# Patient Record
Sex: Male | Born: 1985 | Race: Black or African American | Hispanic: No | Marital: Married | Smoking: Current every day smoker
Health system: Southern US, Community
[De-identification: ages and names within clinical notes are randomized; demographics above are authoritative.]

## PROBLEM LIST (undated history)

## (undated) DIAGNOSIS — Q899 Congenital malformation, unspecified: Secondary | ICD-10-CM

## (undated) DIAGNOSIS — G43909 Migraine, unspecified, not intractable, without status migrainosus: Secondary | ICD-10-CM

## (undated) HISTORY — PX: HIP SURGERY: SHX245

---

## 2005-11-09 ENCOUNTER — Emergency Department: Payer: Self-pay | Admitting: Emergency Medicine

## 2010-07-07 ENCOUNTER — Emergency Department: Payer: Self-pay | Admitting: Emergency Medicine

## 2016-05-23 ENCOUNTER — Emergency Department
Admission: EM | Admit: 2016-05-23 | Discharge: 2016-05-23 | Disposition: A | Discharge status: Home / Self Care | Payer: Self-pay | Attending: Emergency Medicine | Admitting: Emergency Medicine

## 2016-05-23 ENCOUNTER — Encounter: Payer: Self-pay | Admitting: Emergency Medicine

## 2016-05-23 DIAGNOSIS — F17210 Nicotine dependence, cigarettes, uncomplicated: Secondary | ICD-10-CM | POA: Insufficient documentation

## 2016-05-23 DIAGNOSIS — Z0000 Encounter for general adult medical examination without abnormal findings: Secondary | ICD-10-CM | POA: Insufficient documentation

## 2016-05-23 DIAGNOSIS — Z791 Long term (current) use of non-steroidal anti-inflammatories (NSAID): Secondary | ICD-10-CM | POA: Insufficient documentation

## 2016-05-23 NOTE — ED Notes (Signed)
Police officer at bedside

## 2016-05-23 NOTE — ED Notes (Signed)
Police office at bedside

## 2016-05-23 NOTE — ED Notes (Signed)
Pt discharged to home with officer.  Discharged paperwork reviewed with patient.  Pt verbalized understanding

## 2016-05-23 NOTE — ED Notes (Signed)
Pt resting in bed, called for blanket
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
## 2017-06-20 ENCOUNTER — Encounter: Payer: Self-pay | Admitting: *Deleted

## 2017-06-20 ENCOUNTER — Emergency Department: Payer: Worker's Compensation

## 2017-06-20 ENCOUNTER — Emergency Department
Admission: EM | Admit: 2017-06-20 | Discharge: 2017-06-20 | Disposition: A | Discharge status: Home / Self Care | Payer: Worker's Compensation | Attending: Emergency Medicine | Admitting: Emergency Medicine

## 2017-06-20 DIAGNOSIS — S161XXA Strain of muscle, fascia and tendon at neck level, initial encounter: Secondary | ICD-10-CM | POA: Diagnosis not present

## 2017-06-20 DIAGNOSIS — F17210 Nicotine dependence, cigarettes, uncomplicated: Secondary | ICD-10-CM | POA: Insufficient documentation

## 2017-06-20 DIAGNOSIS — Y939 Activity, unspecified: Secondary | ICD-10-CM | POA: Insufficient documentation

## 2017-06-20 DIAGNOSIS — Y999 Unspecified external cause status: Secondary | ICD-10-CM | POA: Insufficient documentation

## 2017-06-20 DIAGNOSIS — W228XXA Striking against or struck by other objects, initial encounter: Secondary | ICD-10-CM | POA: Diagnosis not present

## 2017-06-20 DIAGNOSIS — Y929 Unspecified place or not applicable: Secondary | ICD-10-CM | POA: Diagnosis not present

## 2017-06-20 DIAGNOSIS — S0990XA Unspecified injury of head, initial encounter: Secondary | ICD-10-CM

## 2017-06-20 MED ORDER — ORPHENADRINE CITRATE 30 MG/ML IJ SOLN
60.0000 mg | Freq: Once | INTRAMUSCULAR | Status: AC
  Administered 2017-06-20: 60 mg via INTRAMUSCULAR
  Filled 2017-06-20: qty 2

## 2017-06-20 MED ORDER — KETOROLAC TROMETHAMINE 30 MG/ML IJ SOLN
30.0000 mg | Freq: Once | INTRAMUSCULAR | Status: AC
Start: 2017-06-20 — End: 2017-06-20
  Administered 2017-06-20: 30 mg via INTRAMUSCULAR
  Filled 2017-06-20: qty 1

## 2017-06-20 MED ORDER — MELOXICAM 15 MG PO TABS: 15.0000 mg | ORAL_TABLET | Freq: Every day | ORAL | 0 refills | Status: DC

## 2017-06-20 MED ORDER — METHOCARBAMOL 500 MG PO TABS: 500.0000 mg | ORAL_TABLET | Freq: Four times a day (QID) | ORAL | 0 refills | Status: AC

## 2017-06-20 NOTE — ED Notes (Signed)
Pt returned from scans via stretcher.  

## 2017-06-20 NOTE — ED Notes (Signed)
PT WAS SENT TO ED BY [employer]. WE WERE NOT AWARE THE PT WAS A WORKERS COMP PT UNTIL [facility]IA ( PT ACCT. REP) INFORMED US. I NOTIFIED LISA (RN)TO HELP ME PULL THE EMPLOYER PROFILE. I CALLED THE CONTACT PERSON (LEAH STALLINGS) TO SEE WHICH URINE PANEL TO RUN. SHE DIDN'T KNOW SO SHE HAD MEGAN HAHNE CALL THIS NA BACK WITH THE CORRECT INFORMATION. I INFORMED MEGAN I WOULD SEND ALL THE PAPERWORK BACK WITH THE PT. CURRENTLY WAITING FOR PT TO PROVIDE ANOTHER URINE SAMPLE.

## 2017-06-20 NOTE — ED Provider Notes (Signed)
[facility] Emergency Department Provider Note  ____________________________________________  Time seen: Approximately 7:21 PM  I have reviewed the triage vital signs and the nursing notes.   HISTORY  Chief Complaint Head Injury and Neck Injury    HPI Adam Freeman is a 31 y.o. male who presents to emergency department complaining of headache, neck pain, low back pain status post an injury today. Patient reports that he was working when a metal box being moved by a crane struck him in the back of the head/neck region. Patient reports that he did not lose consciousness but had a sudden lightning strike" sensation when through his body. Patient reports that sensation was momentary and has since resolved. He denies any radicular symptoms at this time. Patient reports an ongoing low-grade headache and some neck and back pain. Patient reports that it feels "tight". No medications for this complaint prior to arrival. No other symptoms or complaints at this time.   No past medical history on file.  There are no active problems to display for this patient.   No past surgical history on file.  Prior to Admission medications   Medication Sig Start Date End Date Taking? Authorizing Provider  etodolac (LODINE) 500 MG tablet Take 500 mg by mouth 2 (two) times daily.    [provider]  meloxicam (MOBIC) 15 MG tablet Take 1 tablet (15 mg total) by mouth daily. 06/20/17   Marylou Wages, Delorise Royals, PA-C  methocarbamol (ROBAXIN) 500 MG tablet Take 1 tablet (500 mg total) by mouth 4 (four) times daily. 06/20/17   Canda Podgorski, Delorise Royals, PA-C    Allergies Penicillins  No family history on file.  Social History Social History  Substance Use Topics  . Smoking status: Current Every Day Smoker    Types: Cigarettes  . Smokeless tobacco: Never Used  . Alcohol use Yes     Review of Systems  Constitutional: No fever/chills Eyes: No visual changes.  Cardiovascular:  no chest pain. Respiratory: no cough. No SOB. Gastrointestinal: No abdominal pain.  No nausea, no vomiting.   Musculoskeletal: Positive for neck and back pain Skin: Negative for rash, abrasions, lacerations, ecchymosis. Neurological: Positive for headache but denies focal weakness or numbness. 10-point ROS otherwise negative.  ____________________________________________   PHYSICAL EXAM:  VITAL SIGNS: ED Triage Vitals  Enc Vitals Group     BP 06/20/17 1854 (!) 115/48     Pulse Rate 06/20/17 1854 64     Resp 06/20/17 1854 20     Temp 06/20/17 1854 98.6 F (37 C)     Temp Source 06/20/17 1854 Oral     SpO2 06/20/17 1854 99 %     Weight 06/20/17 1854 170 lb (77.1 kg)     Height 06/20/17 1854 5\' 11"  (1.803 m)     Head Circumference --      Peak Flow --      Pain Score 06/20/17 1852 9     Pain Loc --      Pain Edu? --      Excl. in GC? --      Constitutional: Alert and oriented. Well appearing and in no acute distress. Eyes: Conjunctivae are normal. PERRL. EOMI. Head: Atraumatic. Patient is nontender to palpation over the osseous structures of the skull and face. Signs. No raccoon eyes. No serous fluid drainage from ears or nares. ENT:      Ears:       Nose: No congestion/rhinnorhea.      Mouth/Throat: Mucous membranes  are moist.  Neck: No stridor.  No midline cervical spine tenderness to palpation. Patient is diffusely tender to palpation bilateral paraspinal muscle groups. No palpable abnormality. Radial pulse intact bilateral upper extremities. Sensation intact and equal bilateral upper extremities and all dermatomal distributions.  Cardiovascular: Normal rate, regular rhythm. Normal S1 and S2.  Good peripheral circulation. Respiratory: Normal respiratory effort without tachypnea or retractions. Lungs CTAB. Good air entry to the bases with no decreased or absent breath sounds. Musculoskeletal: Full range of motion to all extremities. No gross deformities appreciated. No  visible deformity or edema noted to spine upon inspection. Full range of motion to thoracic and lumbar spine. No tenderness to palpation. No palpable abnormality. Dorsalis pedis pulse intact bilateral lower extremity strength. Sensation intact and equal bilateral lower extremities in all dermatomal distributions. Neurologic:  Normal speech and language. No gross focal neurologic deficits are appreciated. Cranial nerves II through XII grossly intact. Skin:  Skin is warm, dry and intact. No rash noted. Psychiatric: Mood and affect are normal. Speech and behavior are normal. Patient exhibits appropriate insight and judgement.   ____________________________________________   LABS (all labs ordered are listed, but only abnormal results are displayed)  Labs Reviewed - No data to display ____________________________________________  EKG   ____________________________________________  RADIOLOGY Festus BarrenI, Jayen Bromwell D Nicolae Vasek, personally viewed and evaluated these images (plain radiographs) as part of my medical decision making, as well as reviewing the written report by the radiologist.  Dg Lumbar Spine Complete  Result Date: 06/20/2017 CLINICAL DATA:  Patient with head injury. Headache. Neck pain. Numbness. Low back pain. Initial encounter. EXAM: LUMBAR SPINE - COMPLETE 4+ VIEW COMPARISON:  None. FINDINGS: Normal anatomic alignment. Preservation of the intervertebral disc space heights. No evidence for static listhesis. T11-12 degenerative disc disease. Age-indeterminate superior endplate height loss of the T11 vertebral body. SI joints are unremarkable. IMPRESSION: Age-indeterminate height loss of the superior endplate of the T11 vertebral body. Recommend correlation for point tenderness. Unremarkable appearance of the lumbar spine. Electronically Signed   By: Annia Beltrew  Davis M.D.   On: 06/20/2017 20:13   Ct Head Wo Contrast  Result Date: 06/20/2017 CLINICAL DATA:  Patient struck back of head on metal at  work today. Headache and neck pain. EXAM: CT HEAD WITHOUT CONTRAST CT CERVICAL SPINE WITHOUT CONTRAST TECHNIQUE: Multidetector CT imaging of the head and cervical spine was performed following the standard protocol without intravenous contrast. Multiplanar CT image reconstructions of the cervical spine were also generated. COMPARISON:  None. FINDINGS: CT HEAD FINDINGS Brain: There is no evidence for acute hemorrhage, hydrocephalus, mass lesion, or abnormal extra-axial fluid collection. No definite CT evidence for acute infarction. Soft tissue fullness noted in the foramen magnum in although there is prominent streak artifact in this region, and a component of cerebellar tonsillar ectopia cannot be excluded. Vascular: No hyperdense vessel or unexpected calcification. Skull: No evidence for fracture. No worrisome lytic or sclerotic lesion. Sinuses/Orbits: The visualized paranasal sinuses and mastoid air cells are clear. Visualized portions of the globes and intraorbital fat are unremarkable. Other: None. CT CERVICAL SPINE FINDINGS Alignment: Straightening of normal cervical lordosis. No subluxation. Skull base and vertebrae: No acute fracture. No primary bone lesion or focal pathologic process. Soft tissues and spinal canal: No prevertebral fluid or swelling. No visible canal hematoma. Soft tissue fullness noted in the region of the foramen magnum. Disc levels:  Preserved. Upper chest: Negative. Other: None. IMPRESSION: 1. No acute intracranial abnormality. 2. Soft tissue fullness in the foramen magnum, suspicious for  cerebellar tonsillar ectopia. Follow-up nonemergent outpatient brain MRI could be used to further evaluate, as clinically warranted. 3. No evidence for cervical spine fracture. Loss of cervical lordosis. This can be related to patient positioning, muscle spasm or soft tissue injury. Electronically Signed   By: Kennith Center M.D.   On: 06/20/2017 20:22   Ct Cervical Spine Wo Contrast  Result Date:  06/20/2017 CLINICAL DATA:  Patient struck back of head on metal at work today. Headache and neck pain. EXAM: CT HEAD WITHOUT CONTRAST CT CERVICAL SPINE WITHOUT CONTRAST TECHNIQUE: Multidetector CT imaging of the head and cervical spine was performed following the standard protocol without intravenous contrast. Multiplanar CT image reconstructions of the cervical spine were also generated. COMPARISON:  None. FINDINGS: CT HEAD FINDINGS Brain: There is no evidence for acute hemorrhage, hydrocephalus, mass lesion, or abnormal extra-axial fluid collection. No definite CT evidence for acute infarction. Soft tissue fullness noted in the foramen magnum in although there is prominent streak artifact in this region, and a component of cerebellar tonsillar ectopia cannot be excluded. Vascular: No hyperdense vessel or unexpected calcification. Skull: No evidence for fracture. No worrisome lytic or sclerotic lesion. Sinuses/Orbits: The visualized paranasal sinuses and mastoid air cells are clear. Visualized portions of the globes and intraorbital fat are unremarkable. Other: None. CT CERVICAL SPINE FINDINGS Alignment: Straightening of normal cervical lordosis. No subluxation. Skull base and vertebrae: No acute fracture. No primary bone lesion or focal pathologic process. Soft tissues and spinal canal: No prevertebral fluid or swelling. No visible canal hematoma. Soft tissue fullness noted in the region of the foramen magnum. Disc levels:  Preserved. Upper chest: Negative. Other: None. IMPRESSION: 1. No acute intracranial abnormality. 2. Soft tissue fullness in the foramen magnum, suspicious for cerebellar tonsillar ectopia. Follow-up nonemergent outpatient brain MRI could be used to further evaluate, as clinically warranted. 3. No evidence for cervical spine fracture. Loss of cervical lordosis. This can be related to patient positioning, muscle spasm or soft tissue injury. Electronically Signed   By: Kennith Center M.D.   On:  06/20/2017 20:22    ____________________________________________    PROCEDURES  Procedure(s) performed:    Procedures    Medications  ketorolac (TORADOL) 30 MG/ML injection 30 mg (30 mg Intramuscular Given 06/20/17 2144)  orphenadrine (NORFLEX) injection 60 mg (60 mg Intramuscular Given 06/20/17 2144)     ____________________________________________   INITIAL IMPRESSION / ASSESSMENT AND PLAN / ED COURSE  Pertinent labs & imaging results that were available during my care of the patient were reviewed by me and considered in my medical decision making (see chart for details).  Review of the [place] CSRS was performed in accordance of the [facility] prior to dispensing any controlled drugs.     Patient's diagnosis is consistent with minor head injury with strain of neck muscle. Patient's CT scan of the head and neck returned without any acute intracranial or osseous abnormality. Incidental finding consistent with Chiari malformation is visualized. At this time, patient is stable for discharge with symptomatic control medications. 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 Patient was given Toradol and muscle relaxer emergency department for symptom control. Patient will be discharged home with prescriptions for meloxicam and Robaxin for symptom control.   On the CT scan, incidental finding of possible Chiari malformation is visualized. Patient is currently asymptomatic free and does not suffer from chronic neurological deficits. At this time, there is no emergent condition warranting further evaluation with MRI. Patient is advised of findings and  verbalizes that he will follow up with primary care for MRI on an outpatient basis. Should anything change before he is evaluated with MRI or by primary care, he understands to present to the emergency department.   Patient is given ED precautions to return to the ED for any worsening or new  symptoms.     ____________________________________________  FINAL CLINICAL IMPRESSION(S) / ED DIAGNOSES  Final diagnoses:  Minor head injury, initial encounter  Strain of neck muscle, initial encounter      NEW MEDICATIONS STARTED DURING THIS VISIT:  Discharge Medication List as of 06/20/2017  9:41 PM    START taking these medications   Details  meloxicam (MOBIC) 15 MG tablet Take 1 tablet (15 mg total) by mouth daily., Starting Tue 06/20/2017, Print    methocarbamol (ROBAXIN) 500 MG tablet Take 1 tablet (500 mg total) by mouth 4 (four) times daily., Starting Tue 06/20/2017, Print            This chart was dictated using voice recognition software/Dragon. Despite best efforts to proofread, errors can occur which can change the meaning. Any change was purely unintentional.    Racheal Patches, PA-C 06/21/17 0000    Minna Antis, MD 06/30/17 [phone]

## 2017-06-20 NOTE — ED Triage Notes (Signed)
Pt reports striking back of head on metal at work today.  No loc.  Pt has a headache, neck pain and had a moment of numbness thruout the body.  No numbness in traige.  Pt alert.  Speech clear    No n/v.  c-collar applied in triage.

## 2018-08-02 ENCOUNTER — Encounter: Payer: Self-pay | Admitting: Emergency Medicine

## 2018-08-02 ENCOUNTER — Emergency Department: Payer: Self-pay

## 2018-08-02 ENCOUNTER — Emergency Department
Admission: EM | Admit: 2018-08-02 | Discharge: 2018-08-02 | Disposition: A | Discharge status: Home / Self Care | Payer: Self-pay | Attending: Emergency Medicine | Admitting: Emergency Medicine

## 2018-08-02 ENCOUNTER — Other Ambulatory Visit: Payer: Self-pay

## 2018-08-02 DIAGNOSIS — F17210 Nicotine dependence, cigarettes, uncomplicated: Secondary | ICD-10-CM | POA: Insufficient documentation

## 2018-08-02 DIAGNOSIS — Y9389 Activity, other specified: Secondary | ICD-10-CM | POA: Insufficient documentation

## 2018-08-02 DIAGNOSIS — Z79899 Other long term (current) drug therapy: Secondary | ICD-10-CM | POA: Insufficient documentation

## 2018-08-02 DIAGNOSIS — M7062 Trochanteric bursitis, left hip: Secondary | ICD-10-CM | POA: Insufficient documentation

## 2018-08-02 MED ORDER — MELOXICAM 15 MG PO TABS: 15.0000 mg | ORAL_TABLET | Freq: Every day | ORAL | 1 refills | Status: AC

## 2018-08-02 MED ORDER — PREDNISONE 50 MG PO TABS: ORAL_TABLET | ORAL | 0 refills | Status: AC
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
## 2018-08-02 NOTE — ED Notes (Signed)
Pt states pain significantly increased starting today and is worse than his normal chronic pain. Pt states in past he has been taking ibuprofen and aleve, heat pack application to the area to decrease pain but says that is not working for the pain he is having today.
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
## 2018-08-16 ENCOUNTER — Emergency Department
Admission: EM | Admit: 2018-08-16 | Discharge: 2018-08-16 | Disposition: A | Discharge status: Home / Self Care | Payer: Self-pay | Attending: Emergency Medicine | Admitting: Emergency Medicine

## 2018-08-16 ENCOUNTER — Other Ambulatory Visit: Payer: Self-pay

## 2018-08-16 ENCOUNTER — Encounter: Payer: Self-pay | Admitting: Emergency Medicine

## 2018-08-16 DIAGNOSIS — M25552 Pain in left hip: Secondary | ICD-10-CM | POA: Insufficient documentation

## 2018-08-16 DIAGNOSIS — F17210 Nicotine dependence, cigarettes, uncomplicated: Secondary | ICD-10-CM | POA: Insufficient documentation

## 2018-08-16 DIAGNOSIS — Z79899 Other long term (current) drug therapy: Secondary | ICD-10-CM | POA: Insufficient documentation

## 2018-08-16 MED ORDER — LIDOCAINE 5 % EX PTCH
1.0000 | MEDICATED_PATCH | CUTANEOUS | Status: DC
  Administered 2018-08-16: 1 via TRANSDERMAL
  Filled 2018-08-16: qty 1

## 2018-08-16 MED ORDER — CYCLOBENZAPRINE HCL 5 MG PO TABS: ORAL_TABLET | ORAL | 0 refills | Status: DC

## 2018-08-16 MED ORDER — KETOROLAC TROMETHAMINE 30 MG/ML IJ SOLN
30.0000 mg | Freq: Once | INTRAMUSCULAR | Status: AC
  Administered 2018-08-16: 30 mg via INTRAMUSCULAR
  Filled 2018-08-16: qty 1

## 2018-08-16 MED ORDER — KETOROLAC TROMETHAMINE 10 MG PO TABS: 10.0000 mg | ORAL_TABLET | Freq: Four times a day (QID) | ORAL | 0 refills | Status: AC | PRN

## 2018-08-16 MED ORDER — LIDOCAINE 5 % EX PTCH: 1.0000 | MEDICATED_PATCH | CUTANEOUS | 0 refills | Status: AC

## 2018-08-16 MED ORDER — KETOROLAC TROMETHAMINE 10 MG PO TABS: 10.0000 mg | ORAL_TABLET | Freq: Four times a day (QID) | ORAL | 0 refills | Status: DC | PRN

## 2018-08-16 NOTE — ED Triage Notes (Signed)
Patient ambulatory to triage with steady gait, without difficulty or distress noted; pt reports seen recently for left hip pain; st is wanting something stronger for pain due to persistent pain

## 2018-08-16 NOTE — ED Notes (Signed)
This RN reviewed discharge instructions, follow-up care, prescriptions, and cryotherapy. Patient verbalized understanding of all reviewed information.  Patient stable, with no distress noted at this time.

## 2018-08-16 NOTE — ED Provider Notes (Signed)
[facility] Emergency Department Provider Note  ____________________________________________  Time seen: Approximately 9:07 PM  I have reviewed the triage vital signs and the nursing notes.   HISTORY  Chief Complaint No chief complaint on file.    HPI Adam Rankinrince O Freeman is a 32 y.o. male presents emergency department for evaluation of left hip pain for months.  Patient states that pain is over the side of his left hip.  Pain is worse at work.  He does not have any groin pain.  He states that he had hip surgery as a child and will have chronic pain in his hips.  He was seen in the emergency department a couple of weeks ago and was given a prescription for prednisone and meloxicam.  He states that medication improved pain but it did not make it go completely away.  He had to miss work tonight due to pain.  History reviewed. No pertinent past medical history.  There are no active problems to display for this patient.   Past Surgical History:  Procedure Laterality Date  . HIP SURGERY Bilateral     Prior to Admission medications   Medication Sig Start Date End Date Taking? Authorizing Provider  etodolac (LODINE) 500 MG tablet Take 500 mg by mouth 2 (two) times daily.    [provider]  ketorolac (TORADOL) 10 MG tablet Take 1 tablet (10 mg total) by mouth every 6 (six) hours as needed. 08/16/18   Enid DerryWagner, Hartlee Amedee, PA-C  lidocaine (LIDODERM) 5 % Place 1 patch onto the skin daily. Remove & Discard patch within 12 hours or as directed by MD 08/16/18   Enid DerryWagner, Dennice Tindol, PA-C  methocarbamol (ROBAXIN) 500 MG tablet Take 1 tablet (500 mg total) by mouth 4 (four) times daily. 06/20/17   Cuthriell, Delorise RoyalsJonathan D, PA-C  predniSONE (DELTASONE) 50 MG tablet Take one 50 mg tablet once daily for the next five days. 08/02/18   Orvil FeilWoods, Jaclyn M, PA-C    Allergies Penicillins  No family history on file.  Social History Social History   Tobacco Use  . Smoking status: Current  Every Day Smoker    Types: Cigarettes  . Smokeless tobacco: Never Used  Substance Use Topics  . Alcohol use: Yes  . Drug use: Yes    Types: Marijuana     Review of Systems  Constitutional: No fever/chills Gastrointestinal: No nausea, no vomiting.  Musculoskeletal: Positive for hip pain.  Skin: Negative for rash, abrasions, lacerations, ecchymosis.   ____________________________________________   PHYSICAL EXAM:  VITAL SIGNS: ED Triage Vitals [08/16/18 2006]  Enc Vitals Group     BP 122/62     Pulse Rate 70     Resp 18     Temp 98 F (36.7 C)     Temp src      SpO2 99 %     Weight 175 lb 0.7 oz (79.4 kg)     Height 5\' 11"  (1.803 m)     Head Circumference      Peak Flow      Pain Score 5     Pain Loc      Pain Edu?      Excl. in GC?      Constitutional: Alert and oriented. Well appearing and in no acute distress. Eyes: Conjunctivae are normal. PERRL. EOMI. Head: Atraumatic. ENT:      Ears:      Nose: No congestion/rhinnorhea.      Mouth/Throat: Mucous membranes are moist.  Neck: No stridor.  Cardiovascular: Normal rate, regular rhythm.  Good peripheral circulation. Respiratory: Normal respiratory effort without tachypnea or retractions. Lungs CTAB. Good air entry to the bases with no decreased or absent breath sounds. Musculoskeletal: Full range of motion to all extremities. No gross deformities appreciated.  Full range of motion of left hip.  Tenderness to palpation superior to trochanteric bursa. Neurologic:  Normal speech and language. No gross focal neurologic deficits are appreciated.  Skin:  Skin is warm, dry and intact. No rash noted. Psychiatric: Mood and affect are normal. Speech and behavior are normal. Patient exhibits appropriate insight and judgement.   ____________________________________________   LABS (all labs ordered are listed, but only abnormal results are displayed)  Labs Reviewed - No data to  display ____________________________________________  EKG   ____________________________________________  RADIOLOGY  No results found.  ____________________________________________    PROCEDURES  Procedure(s) performed:    Procedures    Medications  lidocaine (LIDODERM) 5 % 1 patch (1 patch Transdermal Patch Applied 08/16/18 2134)  ketorolac (TORADOL) 30 MG/ML injection 30 mg (30 mg Intramuscular Given 08/16/18 2134)     ____________________________________________   INITIAL IMPRESSION / ASSESSMENT AND PLAN / ED COURSE  Pertinent labs & imaging results that were available during my care of the patient were reviewed by me and considered in my medical decision making (see chart for details).  Review of the [place] CSRS was performed in accordance of the [facility] prior to dispensing any controlled drugs.   Patient presented to emergency department for evaluation of chronic hip pain.  Vital signs and exam are reassuring.  Patient had imaging completed in the ED 2 weeks ago, which was negative for acute bony abnormalities.  Patient completed a course of prednisone, which he said helped but did not resolve the pain.  IM Toradol was given.  He was encouraged to follow-up with primary care for continued pain control.  Patient will be discharged home with prescriptions for Toradol. Patient is to follow up with primary care as directed.  He already has a orthopedics referral.  Patient is given ED precautions to return to the ED for any worsening or new symptoms.     ____________________________________________  FINAL CLINICAL IMPRESSION(S) / ED DIAGNOSES  Final diagnoses:  Pain of left hip joint      NEW MEDICATIONS STARTED DURING THIS VISIT:  ED Discharge Orders         Ordered    ketorolac (TORADOL) 10 MG tablet  Every 6 hours PRN,   Status:  Discontinued     08/16/18 2157    cyclobenzaprine (FLEXERIL) 5 MG tablet  Status:  Discontinued     08/16/18 2157    ketorolac  (TORADOL) 10 MG tablet  Every 6 hours PRN     08/16/18 2158    lidocaine (LIDODERM) 5 %  Every 24 hours     08/16/18 2158              This chart was dictated using voice recognition software/Dragon. Despite best efforts to proofread, errors can occur which can change the meaning. Any change was purely unintentional.    Enid Derry, PA-C 08/16/18 2248    Dionne Bucy, MD 08/16/18 [phone]

## 2018-11-07 ENCOUNTER — Emergency Department
Admission: EM | Admit: 2018-11-07 | Discharge: 2018-11-07 | Disposition: A | Discharge status: Home / Self Care | Payer: Self-pay | Attending: Emergency Medicine | Admitting: Emergency Medicine

## 2018-11-07 ENCOUNTER — Emergency Department: Payer: Self-pay

## 2018-11-07 ENCOUNTER — Encounter: Payer: Self-pay | Admitting: Emergency Medicine

## 2018-11-07 ENCOUNTER — Other Ambulatory Visit: Payer: Self-pay

## 2018-11-07 DIAGNOSIS — Z79899 Other long term (current) drug therapy: Secondary | ICD-10-CM | POA: Insufficient documentation

## 2018-11-07 DIAGNOSIS — J069 Acute upper respiratory infection, unspecified: Secondary | ICD-10-CM | POA: Insufficient documentation

## 2018-11-07 DIAGNOSIS — F17210 Nicotine dependence, cigarettes, uncomplicated: Secondary | ICD-10-CM | POA: Insufficient documentation

## 2018-11-07 DIAGNOSIS — B9789 Other viral agents as the cause of diseases classified elsewhere: Secondary | ICD-10-CM | POA: Insufficient documentation

## 2018-11-07 NOTE — ED Triage Notes (Signed)
Patient ambulatory to triage with steady gait, without difficulty or distress noted, mask in place; pt reports cough, congestion and fever x 3 days

## 2018-11-07 NOTE — ED Notes (Signed)
Pt walked to DPOD with no distress, has been taking cough med with some relief.

## 2018-11-07 NOTE — ED Provider Notes (Signed)
[facility] Emergency Department Provider Note  ____________________________________________  Time seen: Approximately 9:27 PM  I have reviewed the triage vital signs and the nursing notes.   HISTORY  Chief Complaint Cough    HPI Adam Freeman is a 32 y.o. male who presents the emergency department complaining of nasal congestion, mild sore throat, cough.  Patient reports that he has had low-grade fever as well.  Patient has been taking Tylenol, Mucinex, occasionally picks VapoRub.  Patient states that he does not have insurance, does not want a chest x-ray or prescription medications.  Patient is looking for advice on over-the-counter medications to manage his symptoms.  Patient denies any headache, visual changes, neck pain or stiffness, chest pain, shortness of breath, abdominal pain, nausea vomiting, diarrhea or constipation.   History reviewed. No pertinent past medical history.  There are no active problems to display for this patient.   Past Surgical History:  Procedure Laterality Date  . HIP SURGERY Bilateral     Prior to Admission medications   Medication Sig Start Date End Date Taking? Authorizing Provider  etodolac (LODINE) 500 MG tablet Take 500 mg by mouth 2 (two) times daily.    [provider]  ketorolac (TORADOL) 10 MG tablet Take 1 tablet (10 mg total) by mouth every 6 (six) hours as needed. 08/16/18   Enid DerryWagner, Ashley, PA-C  lidocaine (LIDODERM) 5 % Place 1 patch onto the skin daily. Remove & Discard patch within 12 hours or as directed by MD 08/16/18   Enid DerryWagner, Ashley, PA-C  methocarbamol (ROBAXIN) 500 MG tablet Take 1 tablet (500 mg total) by mouth 4 (four) times daily. 06/20/17   Cuthriell, Delorise RoyalsJonathan D, PA-C  predniSONE (DELTASONE) 50 MG tablet Take one 50 mg tablet once daily for the next five days. 08/02/18   Orvil FeilWoods, Jaclyn M, PA-C    Allergies Penicillins  No family history on file.  Social History Social History   Tobacco  Use  . Smoking status: Current Every Day Smoker    Types: Cigarettes  . Smokeless tobacco: Never Used  Substance Use Topics  . Alcohol use: Yes  . Drug use: Yes    Types: Marijuana     Review of Systems  Constitutional: Positive fever/chills Eyes: No visual changes. No discharge ENT: Positive for nasal congestion and sore throat Cardiovascular: no chest pain. Respiratory: Positive cough. No SOB. Gastrointestinal: No abdominal pain.  No nausea, no vomiting.   Musculoskeletal: Negative for musculoskeletal pain. Skin: Negative for rash, abrasions, lacerations, ecchymosis. Neurological: Negative for headaches, focal weakness or numbness. 10-point ROS otherwise negative.  ____________________________________________   PHYSICAL EXAM:  VITAL SIGNS: ED Triage Vitals [11/07/18 2058]  Enc Vitals Group     BP (!) 114/51     Pulse Rate 96     Resp 18     Temp 99 F (37.2 C)     Temp Source Oral     SpO2 98 %     Weight 170 lb (77.1 kg)     Height 5\' 11"  (1.803 m)     Head Circumference      Peak Flow      Pain Score 0     Pain Loc      Pain Edu?      Excl. in GC?      Constitutional: Alert and oriented. Well appearing and in no acute distress. Eyes: Conjunctivae are normal. PERRL. EOMI. Head: Atraumatic. ENT:      Ears: EACs and TMs unremarkable bilaterally.  Nose: Moderate clear congestion/rhinnorhea.      Mouth/Throat: Mucous membranes are moist.  Oropharynx is minimally erythematous.  Tonsils are mildly erythematous but nonedematous.  No exudates.  Uvula is midline. Neck: No stridor.  Neck is supple full range of motion Hematological/Lymphatic/Immunilogical: No cervical lymphadenopathy. Cardiovascular: Normal rate, regular rhythm. Normal S1 and S2.  Good peripheral circulation. Respiratory: Normal respiratory effort without tachypnea or retractions. Lungs CTAB. Good air entry to the bases with no decreased or absent breath sounds. Musculoskeletal: Full range of  motion to all extremities. No gross deformities appreciated. Neurologic:  Normal speech and language. No gross focal neurologic deficits are appreciated.  Skin:  Skin is warm, dry and intact. No rash noted. Psychiatric: Mood and affect are normal. Speech and behavior are normal. Patient exhibits appropriate insight and judgement.   ____________________________________________   LABS (all labs ordered are listed, but only abnormal results are displayed)  Labs Reviewed - No data to display ____________________________________________  EKG   ____________________________________________  RADIOLOGY   No results found.  ____________________________________________    PROCEDURES  Procedure(s) performed:    Procedures    Medications - No data to display   ____________________________________________   INITIAL IMPRESSION / ASSESSMENT AND PLAN / ED COURSE  Pertinent labs & imaging results that were available during my care of the patient were reviewed by me and considered in my medical decision making (see chart for details).  Review of the [place] CSRS was performed in accordance of the [facility] prior to dispensing any controlled drugs.      Patient's diagnosis is consistent with viral URI with cough.  Patient presents the emergency department with 3-day history of fever, nasal congestion, sore throat, cough.  Patient declines any testing or x-rays at this time.  Patient reports that he has insurance and cannot afford same.  I discussed that there is no limitation to testing in the emergency department given financial status.  Patient verbalizes understanding but states that he "has a virus" and just needs recommendations on over-the-counter medications.  Patient will take Mucinex, Tylenol, Motrin, Flonase over-the-counter for symptoms.  Follow-up with primary care if needed.  No prescriptions at this time. Patient is given ED precautions to return to the ED for any worsening or new  symptoms.     ____________________________________________  FINAL CLINICAL IMPRESSION(S) / ED DIAGNOSES  Final diagnoses:  Viral URI with cough      NEW MEDICATIONS STARTED DURING THIS VISIT:  ED Discharge Orders    None          This chart was dictated using voice recognition software/Dragon. Despite best efforts to proofread, errors can occur which can change the meaning. Any change was purely unintentional.    Racheal Patches, PA-C 11/07/18 2131    Charlynne Pander, MD 11/09/18 [phone]

## 2019-01-03 ENCOUNTER — Encounter: Payer: Self-pay | Admitting: Emergency Medicine

## 2019-01-03 ENCOUNTER — Emergency Department
Admission: EM | Admit: 2019-01-03 | Discharge: 2019-01-03 | Disposition: A | Discharge status: Home / Self Care | Payer: Self-pay | Attending: Emergency Medicine | Admitting: Emergency Medicine

## 2019-01-03 DIAGNOSIS — G44209 Tension-type headache, unspecified, not intractable: Secondary | ICD-10-CM | POA: Insufficient documentation

## 2019-01-03 DIAGNOSIS — F17210 Nicotine dependence, cigarettes, uncomplicated: Secondary | ICD-10-CM | POA: Insufficient documentation

## 2019-01-03 HISTORY — DX: Migraine, unspecified, not intractable, without status migrainosus: G43.909

## 2019-01-03 HISTORY — DX: Congenital malformation, unspecified: Q89.9

## 2019-01-03 MED ORDER — BUTALBITAL-APAP-CAFFEINE 50-325-40 MG PO TABS: 1.0000 | ORAL_TABLET | Freq: Four times a day (QID) | ORAL | 1 refills | Status: AC | PRN

## 2019-01-03 MED ORDER — BUTALBITAL-APAP-CAFFEINE 50-325-40 MG PO TABS
1.0000 | ORAL_TABLET | Freq: Once | ORAL | Status: AC
  Administered 2019-01-03: 1 via ORAL
  Filled 2019-01-03: qty 1

## 2019-01-03 NOTE — ED Notes (Signed)
Pt at desk requesting work note instead of being seen, pt informed that no work note can be given unless seen by Provider.
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
## 2019-01-03 NOTE — ED Triage Notes (Signed)
Patient presents to the ED with headache/migraine x 1 week.  Patient reports history of the same.  Patient denies dizziness, nausea, vomiting and vision problems.  Patient states, "I just need to feel better so I can work [employer]."

## 2019-06-17 ENCOUNTER — Emergency Department
Admission: EM | Admit: 2019-06-17 | Discharge: 2019-06-17 | Disposition: A | Discharge status: Home / Self Care | Payer: Self-pay | Attending: Emergency Medicine | Admitting: Emergency Medicine

## 2019-06-17 ENCOUNTER — Emergency Department: Payer: Self-pay

## 2019-06-17 ENCOUNTER — Encounter: Payer: Self-pay | Admitting: Intensive Care

## 2019-06-17 ENCOUNTER — Other Ambulatory Visit: Payer: Self-pay

## 2019-06-17 DIAGNOSIS — F17210 Nicotine dependence, cigarettes, uncomplicated: Secondary | ICD-10-CM | POA: Insufficient documentation

## 2019-06-17 DIAGNOSIS — Z79899 Other long term (current) drug therapy: Secondary | ICD-10-CM | POA: Insufficient documentation

## 2019-06-17 DIAGNOSIS — M79671 Pain in right foot: Secondary | ICD-10-CM | POA: Insufficient documentation

## 2019-06-17 NOTE — ED Triage Notes (Signed)
C/o right foot pain with growth in between 4th and 5th digit. No injury

## 2019-06-17 NOTE — ED Provider Notes (Signed)
[facility] Emergency Department Provider Note  ____________________________________________   First MD Initiated Contact with Patient 06/17/19 1816     (approximate)  I have reviewed the triage vital signs and the nursing notes.   HISTORY  Chief Complaint Foot Pain (right)    HPI Adam Freeman is a 33 y.o. male presents emergency department complaining of a growth between the fourth and fifth toes.  States it is painful to bear weight.  He wears steel toed boots at work that go up his legs and his pants and socks will be soaked with sweat when he returns home.  He denies any fever or chills.  No known injury.    Past Medical History:  Diagnosis Date  . Congenital defect   . Migraine     There are no active problems to display for this patient.   Past Surgical History:  Procedure Laterality Date  . HIP SURGERY Bilateral     Prior to Admission medications   Medication Sig Start Date End Date Taking? Authorizing Provider  butalbital-acetaminophen-caffeine (FIORICET, ESGIC) 50-325-40 MG tablet Take 1 tablet by mouth every 6 (six) hours as needed for headache. 01/03/19 01/03/20  Cuthriell, Delorise RoyalsJonathan D, PA-C  etodolac (LODINE) 500 MG tablet Take 500 mg by mouth 2 (two) times daily.    [provider]  ketorolac (TORADOL) 10 MG tablet Take 1 tablet (10 mg total) by mouth every 6 (six) hours as needed. 08/16/18   Enid DerryWagner, Ashley, PA-C  lidocaine (LIDODERM) 5 % Place 1 patch onto the skin daily. Remove & Discard patch within 12 hours or as directed by MD 08/16/18   Enid DerryWagner, Ashley, PA-C  methocarbamol (ROBAXIN) 500 MG tablet Take 1 tablet (500 mg total) by mouth 4 (four) times daily. 06/20/17   Cuthriell, Delorise RoyalsJonathan D, PA-C  predniSONE (DELTASONE) 50 MG tablet Take one 50 mg tablet once daily for the next five days. 08/02/18   Orvil FeilWoods, Jaclyn M, PA-C    Allergies Penicillins  History reviewed. No pertinent family history.  Social History Social History    Tobacco Use  . Smoking status: Current Every Day Smoker    Types: Cigarettes  . Smokeless tobacco: Never Used  Substance Use Topics  . Alcohol use: Yes    Alcohol/week: 14.0 standard drinks    Types: 14 Cans of beer per week  . Drug use: Not Currently    Types: Marijuana    Review of Systems  Constitutional: No fever/chills Eyes: No visual changes. ENT: No sore throat. Respiratory: Denies cough Genitourinary: Negative for dysuria. Musculoskeletal: Negative for back pain.  Positive for right foot pain Skin: Negative for rash.    ____________________________________________   PHYSICAL EXAM:  VITAL SIGNS: ED Triage Vitals  Enc Vitals Group     BP 06/17/19 1749 (!) 119/54     Pulse Rate 06/17/19 1749 82     Resp 06/17/19 1749 20     Temp 06/17/19 1749 98.9 F (37.2 C)     Temp Source 06/17/19 1749 Oral     SpO2 06/17/19 1749 98 %     Weight 06/17/19 1749 176 lb (79.8 kg)     Height 06/17/19 1749 5\' 11"  (1.803 m)     Head Circumference --      Peak Flow --      Pain Score 06/17/19 1754 8     Pain Loc --      Pain Edu? --      Excl. in GC? --  Constitutional: Alert and oriented. Well appearing and in no acute distress. Eyes: Conjunctivae are normal.  Head: Atraumatic. Nose: No congestion/rhinnorhea. Mouth/Throat: Mucous membranes are moist.   Neck:  supple no lymphadenopathy noted Cardiovascular: Normal rate, regular rhythm Respiratory: Normal respiratory effort.  No retractions GU: deferred Musculoskeletal: FROM all extremities, warm and well perfused, positive wart/callus-like growth noted on the fifth toe Neurologic:  Normal speech and language.  Skin:  Skin is warm, dry positive for open skin between the fourth and fifth toes with white to green area noted psychiatric: Mood and affect are normal. Speech and behavior are normal.  ____________________________________________   LABS (all labs ordered are listed, but only abnormal results are  displayed)  Labs Reviewed - No data to display ____________________________________________   ____________________________________________  RADIOLOGY  X-ray of the right foot does not show any acute abnormality of the fourth and fifth toes  ____________________________________________   PROCEDURES  Procedure(s) performed: No  Procedures    ____________________________________________   INITIAL IMPRESSION / ASSESSMENT AND PLAN / ED COURSE  Pertinent labs & imaging results that were available during my care of the patient were reviewed by me and considered in my medical decision making (see chart for details).   Patient is a 33 year old male presents emergency department with concerns of a growth on the right foot.  Physical exam shows a small growth noted on the inner aspect of the right fifth toe, open skin noted between the toes.  X-ray of the right foot  ----------------------------------------- 7:40 PM on 06/17/2019 -----------------------------------------  X-ray of the right foot does not show any acute injuries or findings of the area near the right fourth and fifth toes.  No gas pattern.  Explained findings to the patient.  I feel that he should follow-up with podiatry to have the area examined.  Told him very concerned about the wet skin that is open.  This could introduce infection into his foot.  He is to use Lamisil cream and Tinactin or Goldbond powder.  He is to try and keep his feet as dry as possible.  Return emergency department worsening.  He was given a work note and discharged stable condition.    As part of my medical decision making, I reviewed the following data within the [place] notes reviewed and incorporated, Old chart reviewed, Radiograph reviewed x-ray of the right foot is negative, Notes from prior ED visits and [place] Controlled Substance Database  ____________________________________________   FINAL CLINICAL  IMPRESSION(S) / ED DIAGNOSES  Final diagnoses:  Foot pain, right      NEW MEDICATIONS STARTED DURING THIS VISIT:  New Prescriptions   No medications on file     Note:  This document was prepared using Dragon voice recognition software and may include unintentional dictation errors.    Versie Starks, PA-C 06/17/19 1941    Nena Polio, MD 06/17/19 2112

## 2019-06-17 NOTE — Discharge Instructions (Signed)
Use over-the-counter Lamisil cream and either Goldbond powder or Tinactin powder due to the open skin between your toes.  If the area on the inside of the toes becoming worse she should follow-up with podiatry.  Dr. Selina Cooley phone number has been provided for you.  Return if worsening.

## 2020-03-13 ENCOUNTER — Other Ambulatory Visit: Payer: Self-pay

## 2020-03-13 ENCOUNTER — Encounter: Payer: Self-pay | Admitting: Emergency Medicine

## 2020-03-13 ENCOUNTER — Emergency Department
Admission: EM | Admit: 2020-03-13 | Discharge: 2020-03-14 | Discharge status: Against Medical Advice | Payer: Self-pay | Attending: Emergency Medicine | Admitting: Emergency Medicine

## 2020-03-13 DIAGNOSIS — Z5321 Procedure and treatment not carried out due to patient leaving prior to being seen by health care provider: Secondary | ICD-10-CM | POA: Insufficient documentation

## 2020-03-13 LAB — URINALYSIS, COMPLETE (UACMP) WITH MICROSCOPIC
Bacteria, UA: NONE SEEN
Bilirubin Urine: NEGATIVE
Glucose, UA: NEGATIVE mg/dL
Hgb urine dipstick: NEGATIVE
Ketones, ur: NEGATIVE mg/dL
Leukocytes,Ua: NEGATIVE
Nitrite: NEGATIVE
Protein, ur: NEGATIVE mg/dL
Specific Gravity, Urine: 1.021 (ref 1.005–1.030)
pH: 6 (ref 5.0–8.0)

## 2020-03-13 LAB — CBC
HCT: 46.5 % (ref 39.0–52.0)
Hemoglobin: 15.6 g/dL (ref 13.0–17.0)
MCH: 31.2 pg (ref 26.0–34.0)
MCHC: 33.5 g/dL (ref 30.0–36.0)
MCV: 93 fL (ref 80.0–100.0)
Platelets: 224 10*3/uL (ref 150–400)
RBC: 5 MIL/uL (ref 4.22–5.81)
RDW: 12.1 % (ref 11.5–15.5)
WBC: 6.2 10*3/uL (ref 4.0–10.5)
nRBC: 0 % (ref 0.0–0.2)

## 2020-03-13 LAB — COMPREHENSIVE METABOLIC PANEL
ALT: 16 U/L (ref 0–44)
AST: 22 U/L (ref 15–41)
Albumin: 4.5 g/dL (ref 3.5–5.0)
Alkaline Phosphatase: 53 U/L (ref 38–126)
Anion gap: 8 (ref 5–15)
BUN: 11 mg/dL (ref 6–20)
CO2: 25 mmol/L (ref 22–32)
Calcium: 9.1 mg/dL (ref 8.9–10.3)
Chloride: 105 mmol/L (ref 98–111)
Creatinine, Ser: 1.02 mg/dL (ref 0.61–1.24)
GFR calc Af Amer: >60 mL/min (ref >60)
GFR calc non Af Amer: >60 mL/min (ref >60)
Glucose, Bld: 94 mg/dL (ref 70–99)
Potassium: 4 mmol/L (ref 3.5–5.1)
Sodium: 138 mmol/L (ref 135–145)
Total Bilirubin: 0.8 mg/dL (ref 0.3–1.2)
Total Protein: 7.9 g/dL (ref 6.5–8.1)

## 2020-03-13 NOTE — ED Triage Notes (Signed)
Patient states that he was at work and bent over and when he stood up he felt dizzy and nauseated. Patient states that the feeling lasted about 10 minutes.

## 2020-03-16 ENCOUNTER — Telehealth: Payer: Self-pay | Admitting: Emergency Medicine

## 2020-03-16 NOTE — Telephone Encounter (Signed)
Called patient due to lwot to inquire about condition and follow up plans. Person who answered does not know patient and his listed number is hers now.
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
## 2021-03-29 ENCOUNTER — Emergency Department: Payer: Self-pay

## 2021-03-29 ENCOUNTER — Other Ambulatory Visit: Payer: Self-pay

## 2021-03-29 ENCOUNTER — Emergency Department
Admission: EM | Admit: 2021-03-29 | Discharge: 2021-03-29 | Disposition: A | Discharge status: Home / Self Care | Payer: Self-pay | Attending: Emergency Medicine | Admitting: Emergency Medicine

## 2021-03-29 DIAGNOSIS — M25561 Pain in right knee: Secondary | ICD-10-CM | POA: Insufficient documentation

## 2021-03-29 DIAGNOSIS — F17210 Nicotine dependence, cigarettes, uncomplicated: Secondary | ICD-10-CM | POA: Insufficient documentation

## 2021-03-29 DIAGNOSIS — M25461 Effusion, right knee: Secondary | ICD-10-CM | POA: Insufficient documentation

## 2021-03-29 MED ORDER — HYDROCODONE-ACETAMINOPHEN 5-325 MG PO TABS
1.0000 | ORAL_TABLET | ORAL | 0 refills | Status: AC | PRN
Start: 2021-03-29 — End: 2022-03-29

## 2021-03-29 NOTE — Discharge Instructions (Addendum)
Please call the number provided for orthopedics arrange a follow-up appointment for further evaluation.  Please use your crutches with weightbearing as tolerated.  Please take your pain medication as needed but only as prescribed.
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
## 2021-03-29 NOTE — ED Provider Notes (Signed)
[facility] Emergency Department Provider Note  Time seen: 8:51 PM  I have reviewed the triage vital signs and the nursing notes.   HISTORY  Chief Complaint Knee Pain   HPI Adam Freeman is a 35 y.o. male with no significant past medical history presents emergency department for right knee pain.  According to the patient he has a known MCL injury to his right knee but is not had repair as of yet.  States normally he can wear a brace in the knee is okay however he was mowing his lawn and stepped into a hole and felt the knee pop.  Since that time he states significant right knee pain feels like the knee is swollen is not able to fully extend the knee due to pain.  Patient denies any other injuries.  Describes his knee pain is moderate, took tramadol just before arrival.   Past Medical History:  Diagnosis Date  . Congenital defect   . Migraine     There are no problems to display for this patient.   Past Surgical History:  Procedure Laterality Date  . HIP SURGERY Bilateral     Prior to Admission medications   Medication Sig Start Date End Date Taking? Authorizing Provider  etodolac (LODINE) 500 MG tablet Take 500 mg by mouth 2 (two) times daily.    [provider]  ketorolac (TORADOL) 10 MG tablet Take 1 tablet (10 mg total) by mouth every 6 (six) hours as needed. 08/16/18   Enid Derry, PA-C  lidocaine (LIDODERM) 5 % Place 1 patch onto the skin daily. Remove & Discard patch within 12 hours or as directed by MD 08/16/18   Enid Derry, PA-C  methocarbamol (ROBAXIN) 500 MG tablet Take 1 tablet (500 mg total) by mouth 4 (four) times daily. 06/20/17   Cuthriell, Delorise Royals, PA-C  predniSONE (DELTASONE) 50 MG tablet Take one 50 mg tablet once daily for the next five days. 08/02/18   Orvil Feil, PA-C    Allergies  Allergen Reactions  . Penicillins Swelling    No family history on file.  Social History Social History   Tobacco Use  .  Smoking status: Current Every Day Smoker    Types: Cigarettes  . Smokeless tobacco: Never Used  Substance Use Topics  . Alcohol use: Yes  . Drug use: Yes    Types: Marijuana    Review of Systems Constitutional: Negative for fever. Cardiovascular: Negative for chest pain. Respiratory: Negative for shortness of breath. Gastrointestinal: Negative for abdominal pain Musculoskeletal: Right knee pain. Skin: Negative for skin complaints  Neurological: Negative for headache All other ROS negative  ____________________________________________   PHYSICAL EXAM:  VITAL SIGNS: ED Triage Vitals  Enc Vitals Group     BP 03/29/21 2012 98/72     Pulse Rate 03/29/21 2011 86     Resp 03/29/21 2011 18     Temp 03/29/21 2011 98.9 F (37.2 C)     Temp Source 03/29/21 2011 Oral     SpO2 03/29/21 2011 99 %     Weight 03/29/21 2009 185 lb (83.9 kg)     Height 03/29/21 2009 5\' 11"  (1.803 m)     Head Circumference --      Peak Flow --      Pain Score 03/29/21 2009 9     Pain Loc --      Pain Edu? --      Excl. in GC? --     Constitutional:  Alert and oriented. Well appearing and in no distress. Eyes: Normal exam ENT      Head: Normocephalic and atraumatic.      Mouth/Throat: Mucous membranes are moist. Cardiovascular: Normal rate, regular rhythm.  Respiratory: Normal respiratory effort without tachypnea nor retractions. Breath sounds are clear  Gastrointestinal: Soft and nontender. No distention.   Musculoskeletal: Moderate tenderness palpation of the right knee with a mild to moderate joint effusion palpated.  Neuro vastly intact distally.  Moderate pain with range of motion. Neurologic:  Normal speech and language. No gross focal neurologic deficits Skin:  Skin is warm, dry and intact.  Psychiatric: Mood and affect are normal  ____________________________________________   RADIOLOGY  Knee x-rays negative  ____________________________________________   INITIAL IMPRESSION /  ASSESSMENT AND PLAN / ED COURSE  Pertinent labs & imaging results that were available during my care of the patient were reviewed by me and considered in my medical decision making (see chart for details).   Patient presents to the emergency department for right knee pain and swelling after stepping into a hole while mowing a lawn today.  Patient states he felt a pop.  On exam patient has moderate tenderness with pain with range of motion and a mild to moderate joint effusion palpated.  We will obtain an x-ray to rule out bony abnormalities.  If x-rays negative anticipate discharge home with pain medication ice, compression, crutches with weightbearing as tolerated and orthopedic follow-up.  X-rays negative for acute abnormality.  We will discharge with pain medication, crutches with weightbearing as tolerated and orthopedic follow-up.  Patient agreeable.  Adam Freeman was evaluated in Emergency Department on 03/29/2021 for the symptoms described in the history of present illness. He was evaluated in the context of the global COVID-19 pandemic, which necessitated consideration that the patient might be at risk for infection with the SARS-CoV-2 virus that causes COVID-19. Institutional protocols and algorithms that pertain to the evaluation of patients at risk for COVID-19 are in a state of rapid change based on information released by regulatory bodies including the CDC and federal and state organizations. These policies and algorithms were followed during the patient's care in the ED.  ____________________________________________   FINAL CLINICAL IMPRESSION(S) / ED DIAGNOSES  Right knee pain   Minna Antis, MD 03/29/21 2132

## 2021-03-29 NOTE — ED Notes (Signed)
Pt unable to sign dc , verbalizes dc instructions , all questions answered  

## 2022-05-26 IMAGING — CR DG KNEE COMPLETE 4+V*R*
1 series · 4 of 4 positions shown · non-contrast
Comparison: None.

CLINICAL DATA: Right knee pain

EXAM:
RIGHT KNEE - COMPLETE 4+ VIEW

[Series 1: dg knee complete 4 views right · 0.14mm/px · 4 of 4 slices shown]
[im 1/4]
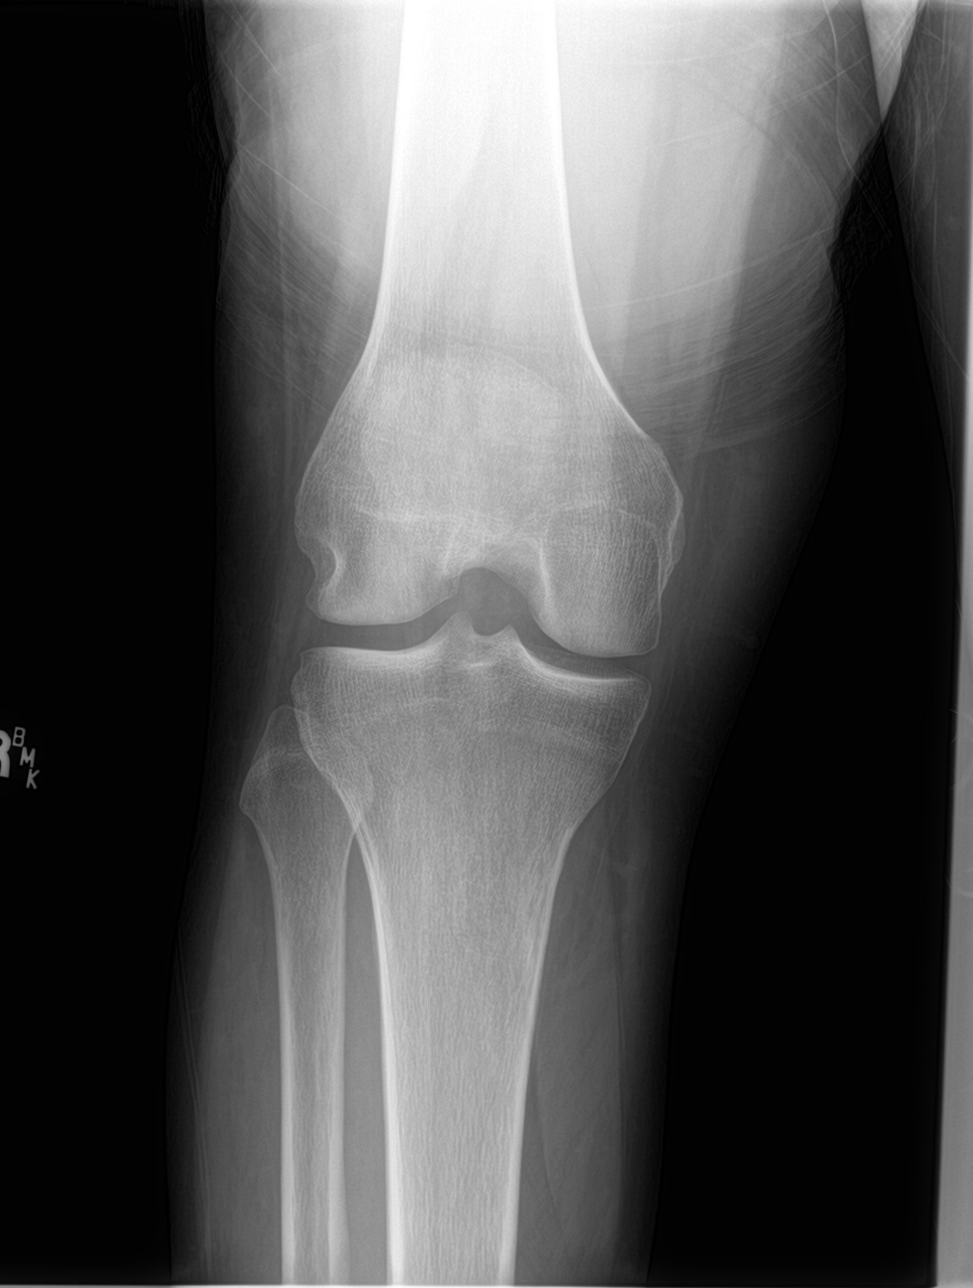
[im 2/4]
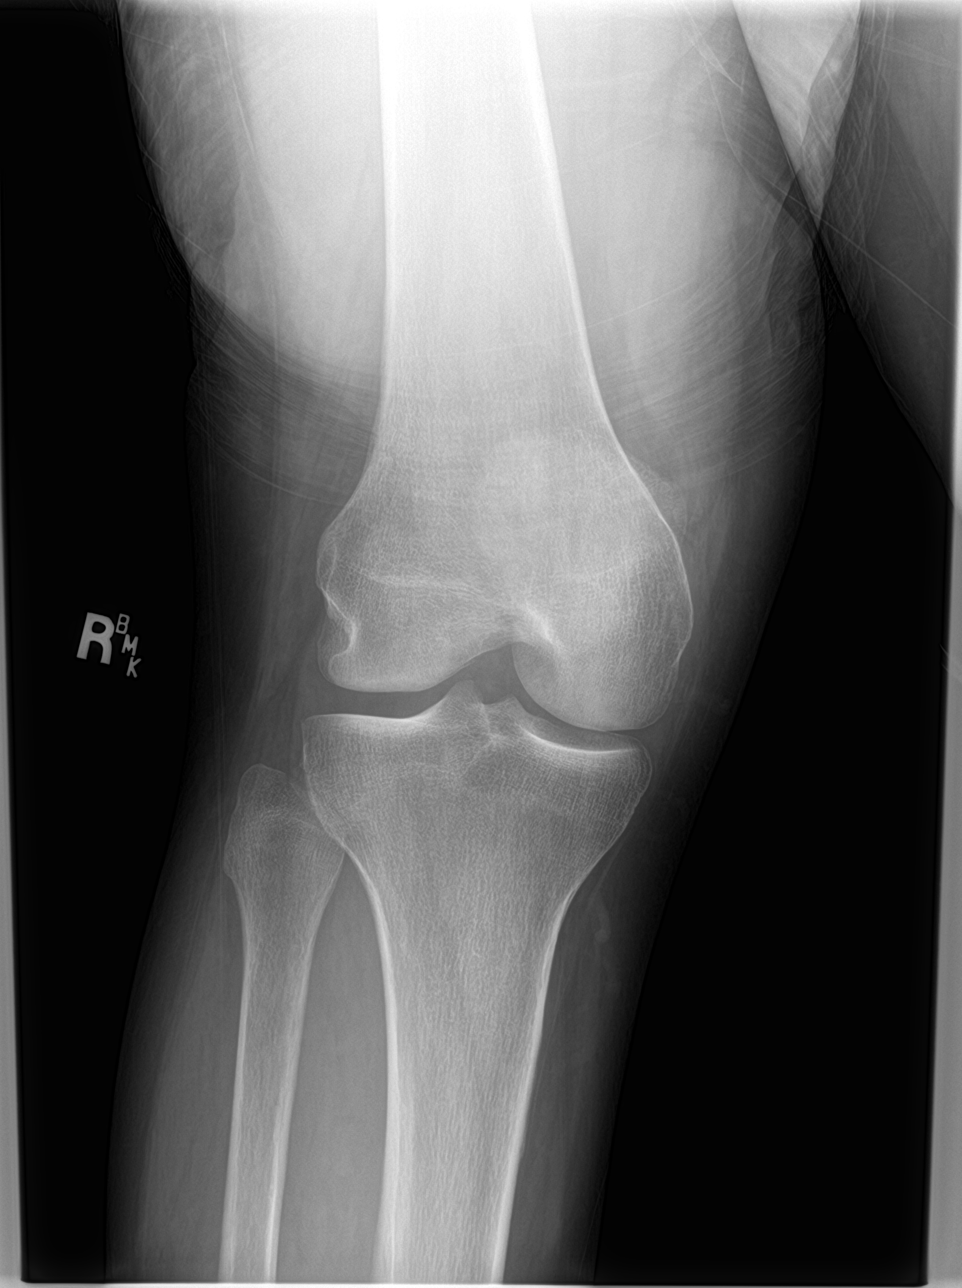
[im 3/4]
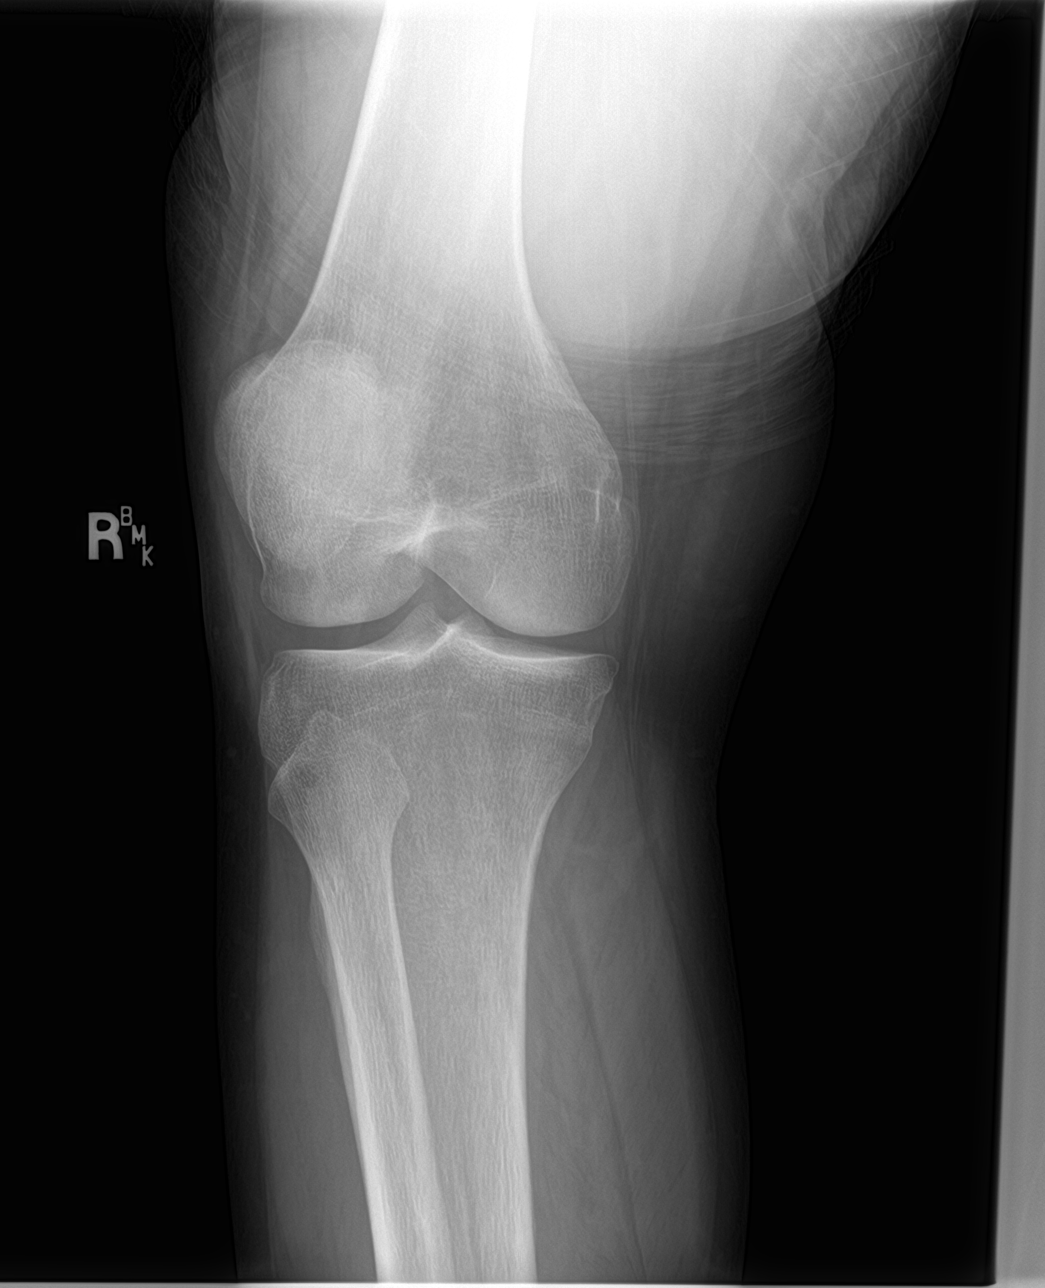
[im 4/4]
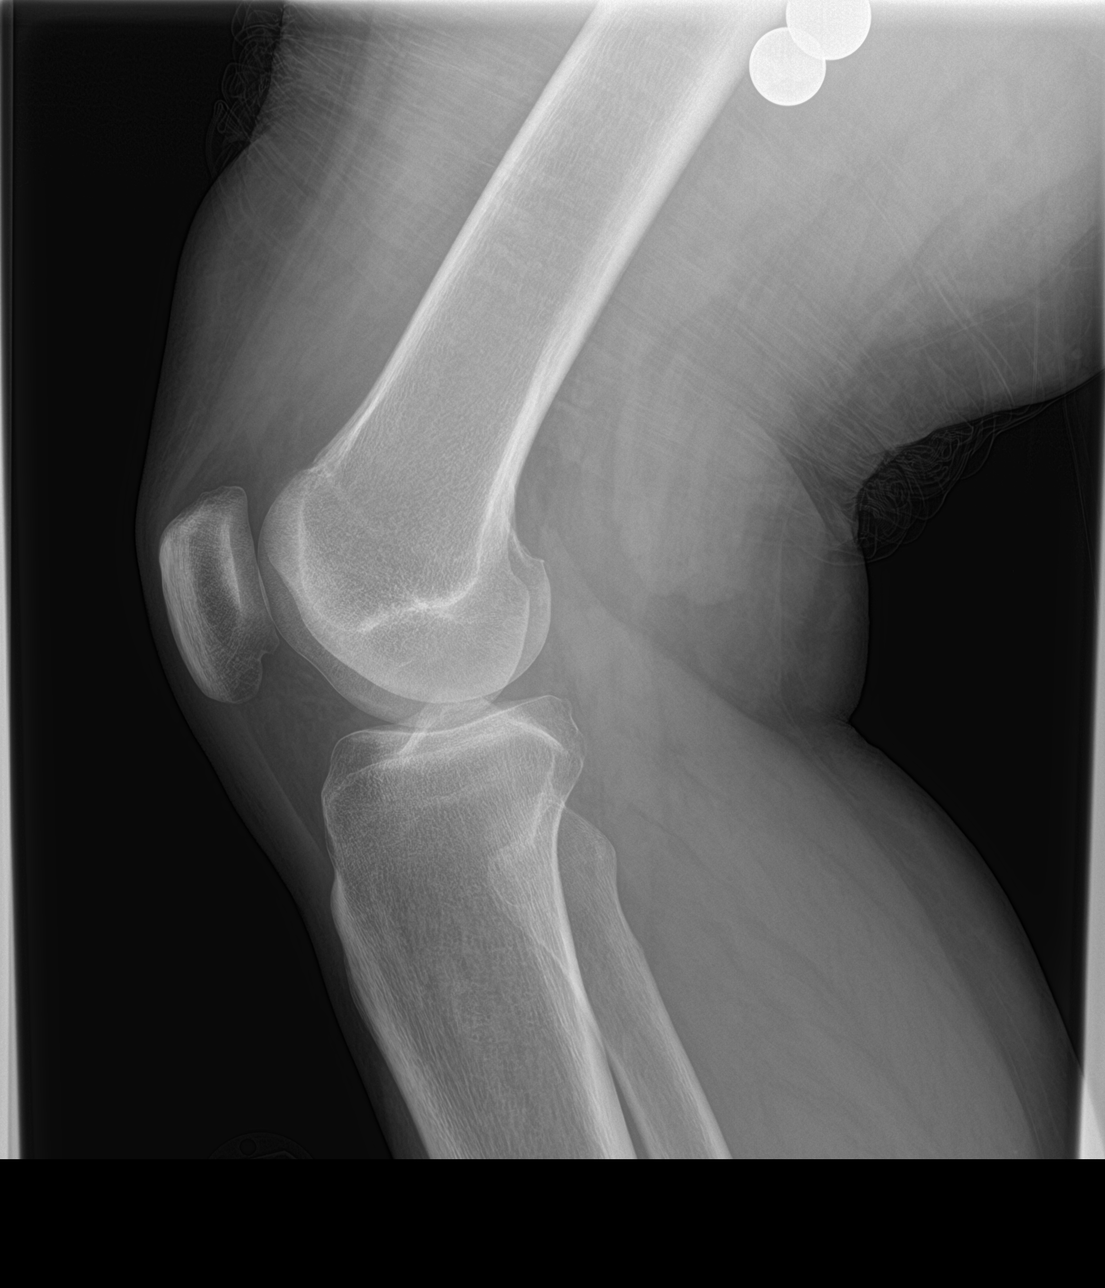

[4 of 4 positions shown; findings below may reference images not displayed]

FINDINGS: No evidence of fracture, dislocation, or joint effusion. No evidence
of arthropathy or other focal bone abnormality. Soft tissues are
unremarkable.
IMPRESSION: Negative.
